# Patient Record
Sex: Female | Born: 1965 | Hispanic: No | Smoking: Current every day smoker
Health system: Southern US, Community
[De-identification: ages and names within clinical notes are randomized; demographics above are authoritative.]

## PROBLEM LIST (undated history)

## (undated) DIAGNOSIS — J45909 Unspecified asthma, uncomplicated: Secondary | ICD-10-CM

## (undated) DIAGNOSIS — F329 Major depressive disorder, single episode, unspecified: Secondary | ICD-10-CM

## (undated) DIAGNOSIS — Q2112 Patent foramen ovale: Secondary | ICD-10-CM

## (undated) DIAGNOSIS — M545 Low back pain, unspecified: Secondary | ICD-10-CM

## (undated) DIAGNOSIS — C801 Malignant (primary) neoplasm, unspecified: Secondary | ICD-10-CM

## (undated) DIAGNOSIS — F4310 Post-traumatic stress disorder, unspecified: Secondary | ICD-10-CM

## (undated) DIAGNOSIS — F32A Depression, unspecified: Secondary | ICD-10-CM

## (undated) DIAGNOSIS — Q211 Atrial septal defect: Secondary | ICD-10-CM

## (undated) DIAGNOSIS — F319 Bipolar disorder, unspecified: Secondary | ICD-10-CM

## (undated) DIAGNOSIS — I340 Nonrheumatic mitral (valve) insufficiency: Secondary | ICD-10-CM

## (undated) HISTORY — DX: Low back pain: M54.5

## (undated) HISTORY — DX: Post-traumatic stress disorder, unspecified: F43.10

## (undated) HISTORY — DX: Patent foramen ovale: Q21.12

## (undated) HISTORY — DX: Major depressive disorder, single episode, unspecified: F32.9

## (undated) HISTORY — DX: Malignant (primary) neoplasm, unspecified: C80.1

## (undated) HISTORY — DX: Atrial septal defect: Q21.1

## (undated) HISTORY — DX: Nonrheumatic mitral (valve) insufficiency: I34.0

## (undated) HISTORY — DX: Bipolar disorder, unspecified: F31.9

## (undated) HISTORY — DX: Depression, unspecified: F32.A

## (undated) HISTORY — DX: Unspecified asthma, uncomplicated: J45.909

## (undated) HISTORY — DX: Low back pain, unspecified: M54.50

---

## 2005-09-15 ENCOUNTER — Ambulatory Visit: Payer: Self-pay | Admitting: *Deleted

## 2005-09-15 ENCOUNTER — Inpatient Hospital Stay (HOSPITAL_COMMUNITY): Admission: EM | Admit: 2005-09-15 | Discharge: 2005-09-18 | Payer: Self-pay | Admitting: *Deleted

## 2005-09-18 ENCOUNTER — Inpatient Hospital Stay (HOSPITAL_COMMUNITY): Admission: EM | Admit: 2005-09-18 | Discharge: 2005-09-20 | Payer: Self-pay | Admitting: Emergency Medicine

## 2007-02-07 IMAGING — CR DG CHEST 1V PORT
1 series · 1 of 1 positions shown · non-contrast
Comparison: none

HISTORY: Chest pain, dyspnea, hypertension

[view not recorded]
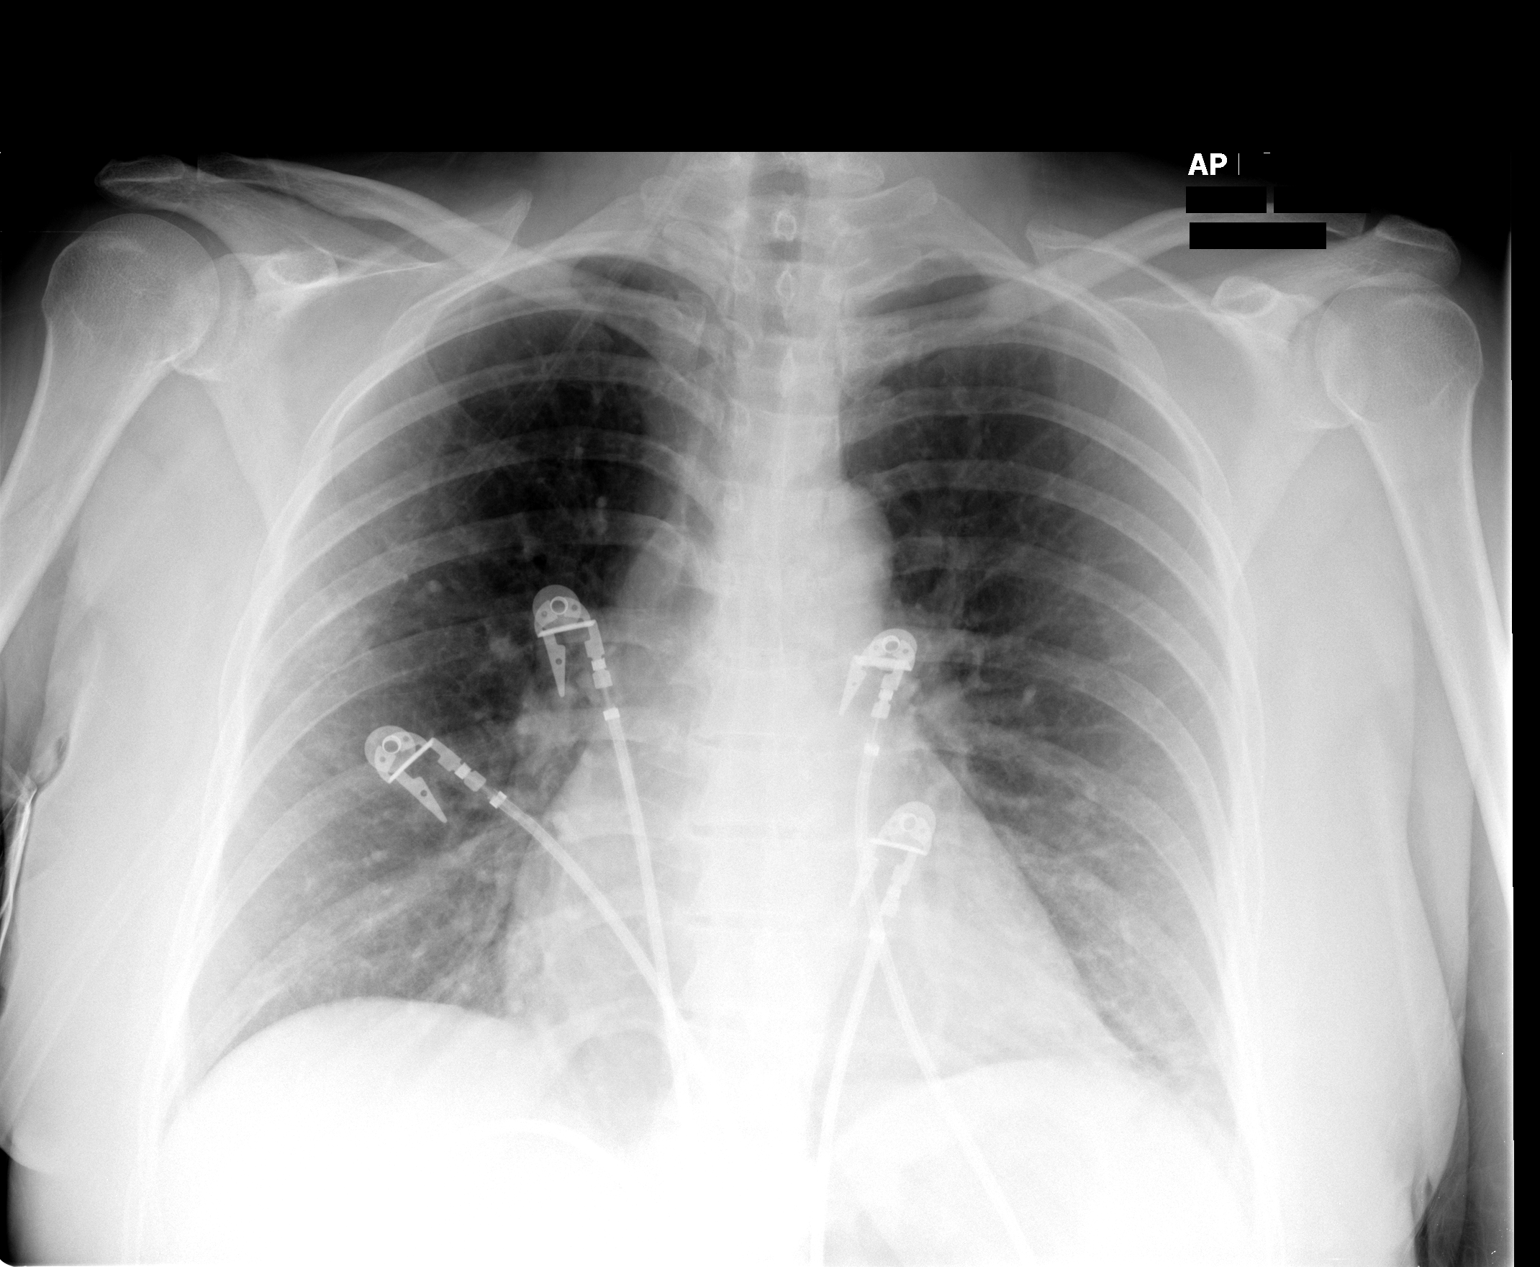

[1 of 1 positions shown; findings below may reference images not displayed]

PORTABLE CHEST ONE VIEW:

Portable exam 2431 hours without priors for comparison.

Normal cardiac and mediastinal silhouettes for technique.
Pulmonary vascularity normal.
Minimal atelectasis or infiltrate at left lung base.
Remaining lungs clear.
Cardiac monitoring lines project over chest.
No pleural effusion or pneumothorax.
IMPRESSION: Minimal atelectasis or infiltrate left lung base.

## 2010-03-11 ENCOUNTER — Encounter: Payer: Self-pay | Admitting: Orthopedic Surgery

## 2010-11-13 ENCOUNTER — Other Ambulatory Visit: Payer: Self-pay | Admitting: Geriatric Medicine

## 2016-03-27 ENCOUNTER — Encounter: Payer: Self-pay | Admitting: Gastroenterology

## 2016-05-28 ENCOUNTER — Encounter: Payer: Self-pay | Admitting: Gastroenterology

## 2016-07-10 ENCOUNTER — Telehealth: Payer: Self-pay

## 2016-07-10 NOTE — Telephone Encounter (Signed)
Patient missed her 10:30  appointment in Pre-visit. A message was left on her voice mail to call and reschedule. If we do not hear from her by the end of the day we will cancel her colonoscopy. Patient was encouraged to call and reschedule. Patient has missed three pre-visits appointments.   Riki Sheer, LPN

## 2016-07-11 ENCOUNTER — Encounter: Payer: Self-pay | Admitting: Internal Medicine

## 2016-07-22 ENCOUNTER — Encounter: Payer: Self-pay | Admitting: Gastroenterology

## 2016-08-23 ENCOUNTER — Encounter: Payer: Self-pay | Admitting: Gastroenterology

## 2021-04-26 ENCOUNTER — Other Ambulatory Visit: Payer: Self-pay

## 2021-04-26 ENCOUNTER — Ambulatory Visit: Payer: Medicare Other | Admitting: Physician Assistant

## 2021-04-26 VITALS — BP 130/71 | HR 67 | Resp 19 | Ht 64.0 in | Wt 118.0 lb

## 2021-04-26 DIAGNOSIS — F4310 Post-traumatic stress disorder, unspecified: Secondary | ICD-10-CM | POA: Insufficient documentation

## 2021-04-26 DIAGNOSIS — N3001 Acute cystitis with hematuria: Secondary | ICD-10-CM | POA: Diagnosis not present

## 2021-04-26 DIAGNOSIS — F411 Generalized anxiety disorder: Secondary | ICD-10-CM

## 2021-04-26 DIAGNOSIS — F319 Bipolar disorder, unspecified: Secondary | ICD-10-CM

## 2021-04-26 DIAGNOSIS — J42 Unspecified chronic bronchitis: Secondary | ICD-10-CM | POA: Diagnosis not present

## 2021-04-26 DIAGNOSIS — R053 Chronic cough: Secondary | ICD-10-CM

## 2021-04-26 LAB — POCT URINALYSIS DIP (CLINITEK)
Bilirubin, UA: NEGATIVE
Glucose, UA: NEGATIVE mg/dL
Ketones, POC UA: NEGATIVE mg/dL
Nitrite, UA: POSITIVE — AB
POC PROTEIN,UA: NEGATIVE
Spec Grav, UA: >=1.03 — AB (ref 1.010–1.025)
Urobilinogen, UA: 0.2 E.U./dL
pH, UA: 6 (ref 5.0–8.0)

## 2021-04-26 MED ORDER — NITROFURANTOIN MONOHYD MACRO 100 MG PO CAPS: 100.0000 mg | ORAL_CAPSULE | Freq: Two times a day (BID) | ORAL | 0 refills | Status: AC

## 2021-04-26 MED ORDER — ALBUTEROL SULFATE HFA 108 (90 BASE) MCG/ACT IN AERS: 2.0000 | INHALATION_SPRAY | Freq: Four times a day (QID) | RESPIRATORY_TRACT | 0 refills | Status: AC | PRN

## 2021-04-26 MED ORDER — CETIRIZINE HCL 10 MG PO TABS: 10.0000 mg | ORAL_TABLET | Freq: Every day | ORAL | 2 refills | Status: AC

## 2021-04-26 MED ORDER — BREO ELLIPTA 100-25 MCG/ACT IN AEPB: 1.0000 | INHALATION_SPRAY | Freq: Every day | RESPIRATORY_TRACT | 1 refills | Status: AC

## 2021-04-26 MED ORDER — PREDNISONE 20 MG PO TABS: 20.0000 mg | ORAL_TABLET | Freq: Every day | ORAL | 0 refills | Status: AC

## 2021-04-26 NOTE — Patient Instructions (Addendum)
I refilled your inhalers and allergy medication for you, I also represcribed the prednisone. ? ?You are positive for urinary tract infection, you will take Macrobid twice a day for the next 5 days. ? ?I hope that you feel better soon, make sure that you follow-up with your primary care provider and behavioral health. ? ?Kennieth Rad, PA-C ?Physician Assistant ?[facility] ?[email or web address] ? ?Urinary Tract Infection, Adult ?A urinary tract infection (UTI) is an infection of any part of the urinary tract. The urinary tract includes the kidneys, ureters, bladder, and urethra. These organs make, store, and get rid of urine in the body. ?An upper UTI affects the ureters and kidneys. A lower UTI affects the bladder and urethra. ?What are the causes? ?Most urinary tract infections are caused by bacteria in your genital area around your urethra, where urine leaves your body. These bacteria grow and cause inflammation of your urinary tract. ?What increases the risk? ?You are more likely to develop this condition if: ?You have a urinary catheter that stays in place. ?You are not able to control when you urinate or have a bowel movement (incontinence). ?You are female and you: ?Use a spermicide or diaphragm for birth control. ?Have low estrogen levels. ?Are pregnant. ?You have certain genes that increase your risk. ?You are sexually active. ?You take antibiotic medicines. ?You have a condition that causes your flow of urine to slow down, such as: ?An enlarged prostate, if you are female. ?Blockage in your urethra. ?A kidney stone. ?A nerve condition that affects your bladder control (neurogenic bladder). ?Not getting enough to drink, or not urinating often. ?You have certain medical conditions, such as: ?Diabetes. ?A weak disease-fighting system (immunesystem). ?Sickle cell disease. ?Gout. ?Spinal cord injury. ?What are the signs or symptoms? ?Symptoms of this  condition include: ?Needing to urinate right away (urgency). ?Frequent urination. This may include small amounts of urine each time you urinate. ?Pain or burning with urination. ?Blood in the urine. ?Urine that smells bad or unusual. ?Trouble urinating. ?Cloudy urine. ?Vaginal discharge, if you are female. ?Pain in the abdomen or the lower back. ?You may also have: ?Vomiting or a decreased appetite. ?Confusion. ?Irritability or tiredness. ?A fever or chills. ?Diarrhea. ?The first symptom in older adults may be confusion. In some cases, they may not have any symptoms until the infection has worsened. ?How is this diagnosed? ?This condition is diagnosed based on your medical history and a physical exam. You may also have other tests, including: ?Urine tests. ?Blood tests. ?Tests for STIs (sexually transmitted infections). ?If you have had more than one UTI, a cystoscopy or imaging studies may be done to determine the cause of the infections. ?How is this treated? ?Treatment for this condition includes: ?Antibiotic medicine. ?Over-the-counter medicines to treat discomfort. ?Drinking enough water to stay hydrated. ?If you have frequent infections or have other conditions such as a kidney stone, you may need to see a health care provider who specializes in the urinary tract (urologist). ?In rare cases, urinary tract infections can cause sepsis. Sepsis is a life-threatening condition that occurs when the body responds to an infection. Sepsis is treated in the hospital with IV antibiotics, fluids, and other medicines. ?Follow these instructions at home: ?Medicines ?Take over-the-counter and prescription medicines only as told by your health care provider. ?If you were prescribed an antibiotic medicine, take it as told by your health care provider. Do not stop using the antibiotic even if you start to feel better. ?General instructions ?Make  sure you: ?Empty your bladder often and completely. Do not hold urine for long  periods of time. ?Empty your bladder after sex. ?Wipe from front to back after urinating or having a bowel movement if you are female. Use each tissue only one time when you wipe. ?Drink enough fluid to keep your urine pale yellow. ?Keep all follow-up visits. This is important. ?Contact a health care provider if: ?Your symptoms do not get better after 1-2 days. ?Your symptoms go away and then return. ?Get help right away if: ?You have severe pain in your back or your lower abdomen. ?You have a fever or chills. ?You have nausea or vomiting. ?Summary ?A urinary tract infection (UTI) is an infection of any part of the urinary tract, which includes the kidneys, ureters, bladder, and urethra. ?Most urinary tract infections are caused by bacteria in your genital area. ?Treatment for this condition often includes antibiotic medicines. ?If you were prescribed an antibiotic medicine, take it as told by your health care provider. Do not stop using the antibiotic even if you start to feel better. ?Keep all follow-up visits. This is important. ?This information is not intended to replace advice given to you by your health care provider. Make sure you discuss any questions you have with your health care provider. ?Document Revised: 09/17/2019 Document Reviewed: 09/17/2019 ?Elsevier Patient Education ? [place]. ? ?

## 2021-04-26 NOTE — Progress Notes (Signed)
Pt has not had food today. Has had coffee. Pt reports continued sinus symptoms and generalized pain all over due to sleeping outside in the cold.  ?

## 2021-04-26 NOTE — Progress Notes (Signed)
New Patient Office Visit  Subjective:  Patient ID: Mikayla Frank, female    DOB: 06/09/1965  Age: 56 y.o. MRN: [id number]  CC:  Chief Complaint  Patient presents with   Sinusitis    HPI JNYLA JORDAN reports that she has chronic bronchitis, states that she has been without her inhalers for the past couple of weeks.  States that she is tested twice a week for COVID, states that she was last tested on Monday and was negative.  States that she goes to [facility] in [place] for her primary care, states that she does see them on a monthly basis.  Reports that she has been having a runny nose with clear discharge as well.  States that she has not had her allergy medications.  States that she uses he uses Zyrtec on a daily basis.  Reports that she has been having burning with urination for the past few days.  States that she was diagnosed with a UTI approximately 3 weeks ago, states that she did take an antibiotic with some relief, but states that she believes it has come back.  States that she goes to [facility] for her behavioral health, states that she sees them on a monthly basis.  States that she has been compliant to her Abilify and Zoloft.  States that she still feels depressed, states that she attributes it to her experiencing homelessness at this time.  States that she is working on securing a home.  States that she does have thoughts that she "wishes someone would kill her" but adamantly denies any plan of self-harm.  States her next appointment with behavioral health is May 07, 2021.    Past Medical History:  Diagnosis Date   Asthma    Bipolar disorder (HCC)    Cancer (HCC)    ovarian,personal hx   Depression    Low back pain    Mitral regurgitation    PFO (patent foramen ovale)    PTSD (post-traumatic stress disorder)     History reviewed. No pertinent surgical history.  History reviewed. No pertinent family history.  Social History   Socioeconomic History    Marital status: Unknown    Spouse name: Not on file   Number of children: Not on file   Years of education: Not on file   Highest education level: Not on file  Occupational History   Not on file  Tobacco Use   Smoking status: Every Day    Types: Cigarettes   Smokeless tobacco: Never  Substance and Sexual Activity   Alcohol use: Not on file   Drug use: Not on file   Sexual activity: Not on file  Other Topics Concern   Not on file  Social History Narrative   Not on file   Social Determinants of Health   Financial Resource Strain: Not on file  Food Insecurity: Not on file  Transportation Needs: Not on file  Physical Activity: Not on file  Stress: Not on file  Social Connections: Not on file  Intimate Partner Violence: Not on file    ROS Review of Systems  Constitutional:  Negative for chills and fever.  HENT:  Positive for congestion, rhinorrhea and sneezing.   Eyes: Negative.   Respiratory:  Positive for cough, shortness of breath and wheezing.   Cardiovascular:  Negative for chest pain.  Gastrointestinal:  Positive for abdominal pain and nausea. Negative for vomiting.  Endocrine: Negative.   Genitourinary:  Positive for dysuria and  urgency. Negative for hematuria and vaginal discharge.  Musculoskeletal: Negative.   Skin: Negative.   Allergic/Immunologic: Negative.   Neurological: Negative.   Hematological: Negative.   Psychiatric/Behavioral:  Positive for dysphoric mood. Negative for self-injury and suicidal ideas. The patient is nervous/anxious.    Objective:   Today's Vitals: BP 130/71 (BP Location: Left Arm, Patient Position: Sitting, Cuff Size: Normal)   Pulse 67   Resp 19   Ht 5\' 4"  (1.626 m)   Wt 118 lb (53.5 kg)   SpO2 98%   BMI 20.25 kg/m   Physical Exam Vitals and nursing note reviewed.  Constitutional:      Appearance: Normal appearance.  HENT:     Head: Normocephalic and atraumatic.     Right Ear: External ear normal.     Left Ear:  External ear normal.     Nose: Nose normal.     Mouth/Throat:     Mouth: Mucous membranes are moist.     Pharynx: Oropharynx is clear.  Eyes:     Extraocular Movements: Extraocular movements intact.     Conjunctiva/sclera: Conjunctivae normal.     Pupils: Pupils are equal, round, and reactive to light.  Cardiovascular:     Rate and Rhythm: Normal rate and regular rhythm.     Pulses: Normal pulses.     Heart sounds: Normal heart sounds.  Pulmonary:     Breath sounds: Wheezing present.     Comments: Slight wheeze noted bilaterally  Abdominal:     General: Bowel sounds are normal.     Tenderness: There is abdominal tenderness in the suprapubic area. There is no right CVA tenderness or left CVA tenderness.  Musculoskeletal:        General: Normal range of motion.     Cervical back: Normal range of motion and neck supple.  Skin:    General: Skin is warm and dry.  Neurological:     General: No focal deficit present.     Mental Status: She is alert and oriented to person, place, and time.  Psychiatric:        Mood and Affect: Mood normal.        Behavior: Behavior normal.        Thought Content: Thought content normal.        Judgment: Judgment normal.    Assessment & Plan:   Problem List Items Addressed This Visit       Respiratory   Chronic bronchitis (HCC) - Primary   Relevant Medications   cetirizine (ZYRTEC) 10 MG tablet   BREO ELLIPTA 100-25 MCG/ACT AEPB   predniSONE (DELTASONE) 20 MG tablet   albuterol (VENTOLIN HFA) 108 (90 Base) MCG/ACT inhaler     Other   PTSD (post-traumatic stress disorder)   Relevant Medications   sertraline (ZOLOFT) 25 MG tablet   GAD (generalized anxiety disorder)   Relevant Medications   sertraline (ZOLOFT) 25 MG tablet   Bipolar disorder (HCC)   Other Visit Diagnoses     Acute cystitis with hematuria       Relevant Medications   nitrofurantoin, macrocrystal-monohydrate, (MACROBID) 100 MG capsule   Other Relevant Orders   POCT  URINALYSIS DIP (CLINITEK) (Completed)   Urinalysis with Culture Reflex   Chronic cough           Outpatient Encounter Medications as of 04/26/2021  Medication Sig   albuterol (VENTOLIN HFA) 108 (90 Base) MCG/ACT inhaler Inhale 2 puffs into the lungs every 6 (six) hours as needed for wheezing or  shortness of breath.   nitrofurantoin, macrocrystal-monohydrate, (MACROBID) 100 MG capsule Take 1 capsule (100 mg total) by mouth 2 (two) times daily for 5 days.   [DISCONTINUED] fluticasone-salmeterol (ADVAIR) 100-50 MCG/ACT AEPB    azithromycin (ZITHROMAX) 500 MG tablet Take 500 mg by mouth daily.   BREO ELLIPTA 100-25 MCG/ACT AEPB Inhale 1 puff into the lungs daily.   cetirizine (ZYRTEC) 10 MG tablet Take 1 tablet (10 mg total) by mouth daily.   predniSONE (DELTASONE) 20 MG tablet Take 1 tablet (20 mg total) by mouth daily for 5 days.   sertraline (ZOLOFT) 25 MG tablet PLEASE SEE ATTACHED FOR DETAILED DIRECTIONS   [DISCONTINUED] albuterol (VENTOLIN HFA) 108 (90 Base) MCG/ACT inhaler SMARTSIG:1 Puff(s) Via Inhaler Every 6 Hours PRN   [DISCONTINUED] BREO ELLIPTA 100-25 MCG/ACT AEPB Inhale 1 puff into the lungs daily.   [DISCONTINUED] cetirizine (ZYRTEC) 10 MG tablet Take by mouth.   [DISCONTINUED] predniSONE (DELTASONE) 20 MG tablet Take 20 mg by mouth daily.   No facility-administered encounter medications on file as of 04/26/2021.  1. Chronic bronchitis, unspecified chronic bronchitis type (HCC) Refill current inhalers, short course prednisone.  Encourage patient to keep follow-up appointment with her primary care provider, patient understands and agrees.  Red flags given for prompt reevaluation. - cetirizine (ZYRTEC) 10 MG tablet; Take 1 tablet (10 mg total) by mouth daily.  Dispense: 30 tablet; Refill: 2 - BREO ELLIPTA 100-25 MCG/ACT AEPB; Inhale 1 puff into the lungs daily.  Dispense: 60 each; Refill: 1 - predniSONE (DELTASONE) 20 MG tablet; Take 1 tablet (20 mg total) by mouth daily for 5 days.   Dispense: 5 tablet; Refill: 0 - albuterol (VENTOLIN HFA) 108 (90 Base) MCG/ACT inhaler; Inhale 2 puffs into the lungs every 6 (six) hours as needed for wheezing or shortness of breath.  Dispense: 8 g; Refill: 0  2. Acute cystitis with hematuria UA positive for urinary tract infection, trial Macrobid.  Patient education given on supportive care, red flags for prompt reevaluation. - nitrofurantoin, macrocrystal-monohydrate, (MACROBID) 100 MG capsule; Take 1 capsule (100 mg total) by mouth 2 (two) times daily for 5 days.  Dispense: 10 capsule; Refill: 0 - POCT URINALYSIS DIP (CLINITEK) - Urinalysis with Culture Reflex  3. Chronic cough   4. PTSD (post-traumatic stress disorder) Encouraged continued compliance to medications, follow-up with behavioral health on May 07, 2021.  5. GAD (generalized anxiety disorder)   6. Bipolar affective disorder, remission status unspecified (HCC)    I have reviewed the patient's medical history (PMH, PSH, Social History, Family History, Medications, and allergies) , and have been updated if relevant. I spent 30 minutes reviewing chart and  face to face time with patient.    Follow-up: Return if symptoms worsen or fail to improve.   Kasandra Knudsen Mayers, PA-C
